# Patient Record
Sex: Male | Born: 1985 | Hispanic: Yes | Marital: Married | Smoking: Never smoker
Health system: Southern US, Community
[De-identification: ages and names within clinical notes are randomized; demographics above are authoritative.]

---

## 2017-12-22 ENCOUNTER — Emergency Department (HOSPITAL_COMMUNITY): Payer: Self-pay

## 2017-12-22 ENCOUNTER — Emergency Department (HOSPITAL_COMMUNITY)
Admission: EM | Admit: 2017-12-22 | Discharge: 2017-12-22 | Disposition: A | Discharge status: Home / Self Care | Payer: Self-pay | Attending: Emergency Medicine | Admitting: Emergency Medicine

## 2017-12-22 ENCOUNTER — Encounter (HOSPITAL_COMMUNITY): Payer: Self-pay | Admitting: Emergency Medicine

## 2017-12-22 DIAGNOSIS — S161XXA Strain of muscle, fascia and tendon at neck level, initial encounter: Secondary | ICD-10-CM | POA: Insufficient documentation

## 2017-12-22 DIAGNOSIS — Y998 Other external cause status: Secondary | ICD-10-CM | POA: Insufficient documentation

## 2017-12-22 DIAGNOSIS — S301XXA Contusion of abdominal wall, initial encounter: Secondary | ICD-10-CM | POA: Insufficient documentation

## 2017-12-22 DIAGNOSIS — Y939 Activity, unspecified: Secondary | ICD-10-CM | POA: Insufficient documentation

## 2017-12-22 DIAGNOSIS — Y92410 Unspecified street and highway as the place of occurrence of the external cause: Secondary | ICD-10-CM | POA: Insufficient documentation

## 2017-12-22 LAB — I-STAT CHEM 8, ED
BUN: 20 mg/dL (ref 6–20)
CALCIUM ION: 1.15 mmol/L (ref 1.15–1.40)
Chloride: 103 mmol/L (ref 101–111)
Creatinine, Ser: 0.9 mg/dL (ref 0.61–1.24)
Glucose, Bld: 99 mg/dL (ref 65–99)
HCT: 45 % (ref 39.0–52.0)
Hemoglobin: 15.3 g/dL (ref 13.0–17.0)
Potassium: 4.8 mmol/L (ref 3.5–5.1)
SODIUM: 139 mmol/L (ref 135–145)
TCO2: 29 mmol/L (ref 22–32)

## 2017-12-22 MED ORDER — NAPROXEN 500 MG PO TABS: 500.0000 mg | ORAL_TABLET | Freq: Two times a day (BID) | ORAL | 0 refills | Status: AC

## 2017-12-22 MED ORDER — IOPAMIDOL (ISOVUE-300) INJECTION 61%
INTRAVENOUS | Status: AC
  Administered 2017-12-22: 100 mL
  Filled 2017-12-22: qty 100

## 2017-12-22 MED ORDER — CYCLOBENZAPRINE HCL 10 MG PO TABS
10.0000 mg | ORAL_TABLET | Freq: Once | ORAL | Status: AC
  Administered 2017-12-22: 10 mg via ORAL
  Filled 2017-12-22: qty 1

## 2017-12-22 MED ORDER — CYCLOBENZAPRINE HCL 10 MG PO TABS: 10.0000 mg | ORAL_TABLET | Freq: Two times a day (BID) | ORAL | 0 refills | Status: AC | PRN

## 2017-12-22 MED ORDER — SODIUM CHLORIDE 0.9 % IJ SOLN
INTRAMUSCULAR | Status: AC
  Filled 2017-12-22: qty 50
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
## 2017-12-22 NOTE — ED Notes (Signed)
Bed: WTR8 Expected date:  Expected time:  Means of arrival:  Comments: 
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
